# Patient Record
Sex: Male | Born: 1975 | Race: White | Hispanic: No | Marital: Married | Smoking: Never smoker
Health system: Southern US, Community
[De-identification: ages and names within clinical notes are randomized; demographics above are authoritative.]

## PROBLEM LIST (undated history)

## (undated) DIAGNOSIS — I10 Essential (primary) hypertension: Secondary | ICD-10-CM

## (undated) DIAGNOSIS — E079 Disorder of thyroid, unspecified: Secondary | ICD-10-CM

---

## 2017-09-04 ENCOUNTER — Encounter: Payer: Self-pay | Admitting: Emergency Medicine

## 2017-09-04 ENCOUNTER — Emergency Department (INDEPENDENT_AMBULATORY_CARE_PROVIDER_SITE_OTHER): Payer: BLUE CROSS/BLUE SHIELD

## 2017-09-04 ENCOUNTER — Other Ambulatory Visit: Payer: Self-pay

## 2017-09-04 ENCOUNTER — Emergency Department (INDEPENDENT_AMBULATORY_CARE_PROVIDER_SITE_OTHER)
Admission: EM | Admit: 2017-09-04 | Discharge: 2017-09-04 | Disposition: A | Discharge status: Home / Self Care | Payer: BLUE CROSS/BLUE SHIELD | Source: Home / Self Care | Attending: Family Medicine | Admitting: Family Medicine

## 2017-09-04 DIAGNOSIS — W268XXA Contact with other sharp object(s), not elsewhere classified, initial encounter: Secondary | ICD-10-CM | POA: Diagnosis not present

## 2017-09-04 DIAGNOSIS — M71122 Other infective bursitis, left elbow: Secondary | ICD-10-CM | POA: Diagnosis not present

## 2017-09-04 DIAGNOSIS — S51012A Laceration without foreign body of left elbow, initial encounter: Secondary | ICD-10-CM

## 2017-09-04 HISTORY — DX: Essential (primary) hypertension: I10

## 2017-09-04 HISTORY — DX: Disorder of thyroid, unspecified: E07.9

## 2017-09-04 MED ORDER — DOXYCYCLINE HYCLATE 100 MG PO CAPS: 100.0000 mg | ORAL_CAPSULE | Freq: Two times a day (BID) | ORAL | 0 refills | Status: AC

## 2017-09-04 MED ORDER — HYDROCODONE-ACETAMINOPHEN 5-325 MG PO TABS: 1.0000 | ORAL_TABLET | Freq: Four times a day (QID) | ORAL | 0 refills | Status: AC | PRN

## 2017-09-04 NOTE — ED Provider Notes (Signed)
[facility]    CSN: [id number] Arrival date & time: 09/04/17  1127     History   Chief Complaint Chief Complaint  Patient presents with  . Elbow Pain    injury    HPI Jordan Newman is a 42 y.o. male.   Patient reports that he lacerated his left elbow on a piece of metal at work about 8 days ago.  The laceration seemed to be healing well without drainage or pain until yesterday when he developed soreness, warmth, and swelling over the olecranon bursa.   He denies fevers, chills, and sweats, and feels well otherwise.  His last Tdap was about 4 to 5 years ago.  The history is provided by the patient.  Arm Injury  Location:  Elbow Elbow location:  L elbow Injury: yes   Time since incident:  8 days Mechanism of injury comment:  Laceration Pain details:    Quality:  Aching   Radiates to:  Does not radiate   Severity:  Mild   Onset quality:  Gradual   Duration:  2 days   Timing:  Constant   Progression:  Worsening Handedness:  Right-handed Foreign body present:  No foreign bodies Tetanus status:  Up to date Relieved by:  Nothing Exacerbated by: flexing ebow and touching area. Ineffective treatments:  None tried Associated symptoms: swelling   Associated symptoms: no decreased range of motion, no fatigue and no fever     Past Medical History:  Diagnosis Date  . Hypertension   . Thyroid disease     There are no active problems to display for this patient.   History reviewed. No pertinent surgical history.     Home Medications    Prior to Admission medications   Medication Sig Start Date End Date Taking? Authorizing Provider  levothyroxine (SYNTHROID, LEVOTHROID) 100 MCG tablet Take 100 mcg by mouth daily before breakfast.   Yes [provider]  lisinopril (PRINIVIL,ZESTRIL) 10 MG tablet Take 10 mg by mouth daily.   Yes [provider]  doxycycline (VIBRAMYCIN) 100 MG capsule Take 1 capsule (100 mg total) by mouth 2 (two) times  daily. Take with food. 09/04/17   Lattie HawBeese, Melquisedec Journey A, MD  HYDROcodone-acetaminophen (NORCO/VICODIN) 5-325 MG tablet Take 1 tablet by mouth every 6 (six) hours as needed for moderate pain. 09/04/17   Lattie HawBeese, Fayez Sturgell A, MD    Family History Family History  Problem Relation Age of Onset  . Diabetes Mother     Social History Social History   Tobacco Use  . Smoking status: Never Smoker  . Smokeless tobacco: Never Used  Substance Use Topics  . Alcohol use: Not Currently  . Drug use: Not on file     Allergies   Patient has no allergy information on record.   Review of Systems Review of Systems  Constitutional: Negative for chills, diaphoresis, fatigue and fever.  Skin: Positive for color change and wound. Negative for rash.  All other systems reviewed and are negative.    Physical Exam Triage Vital Signs ED Triage Vitals  Enc Vitals Group     BP 09/04/17 1154 102/66     Pulse Rate 09/04/17 1154 99     Resp 09/04/17 1154 16     Temp 09/04/17 1154 98.5 F (36.9 C)     Temp Source 09/04/17 1154 Oral     SpO2 09/04/17 1154 99 %     Weight 09/04/17 1157 203 lb (92.1 kg)     Height 09/04/17 1157  6' (1.829 m)     Head Circumference --      Peak Flow --      Pain Score 09/04/17 1156 7     Pain Loc --      Pain Edu? --      Excl. in GC? --    No data found.  Updated Vital Signs BP 102/66 (BP Location: Right Arm)   Pulse 99   Temp 98.5 F (36.9 C) (Oral)   Resp 16   Ht 6' (1.829 m)   Wt 203 lb (92.1 kg)   SpO2 99%   BMI 27.53 kg/m   Visual Acuity Right Eye Distance:   Left Eye Distance:   Bilateral Distance:    Right Eye Near:   Left Eye Near:    Bilateral Near:     Physical Exam  Constitutional: He appears well-developed and well-nourished. No distress.  HENT:  Head: Normocephalic.  Eyes: Pupils are equal, round, and reactive to light.  Cardiovascular: Normal rate.  Pulmonary/Chest: Effort normal.  Musculoskeletal:       Left elbow: He exhibits swelling.  He exhibits normal range of motion. Tenderness found. Olecranon process tenderness noted.       Arms: Left elbow has swelling, tenderness, warmth, and mild erythema over the olecranon bursa.  There is a healing superficial laceration about 8mm long over the bursa.  No drainage from the wound.   Neurological: He is alert.  Skin: Skin is warm and dry.  Nursing note and vitals reviewed.    UC Treatments / Results  Labs (all labs ordered are listed, but only abnormal results are displayed) Labs Reviewed  WOUND CULTURE  BODY FLUID CULTURE    EKG None  Radiology Dg Elbow Complete Left  Result Date: 09/04/2017 CLINICAL DATA:  Soft tissue swelling and redness posteriorly at site of recent laceration EXAM: LEFT ELBOW - COMPLETE 3+ VIEW COMPARISON:  None. FINDINGS: Frontal, lateral, and bilateral oblique views were obtained. No fracture or dislocation. No joint effusion evident. There is soft tissue swelling in the region of the olecranon bursa. There is no air or radiopaque foreign body in this area. There is evidence of soft tissue laceration in this area along the skin surface. IMPRESSION: Soft tissue swelling in the olecranon bursa region. Evidence of soft tissue laceration superficially, but no radiopaque foreign body or soft tissue air. No fracture or dislocation. No appreciable arthropathic change. No joint effusion. Electronically Signed   By: Bretta Bang III M.D.   On: 09/04/2017 12:15    Procedures Procedures  Left olecranon bursa aspiration: Risks and benefits of procedure explained to patient and verbal consent obtained.  Using sterile technique, cleansed affected area with Betadine and alcohol, followed by application of topical refrigerant spray.  With elbow flexed to 90 degrees, achieved local anesthesia with 1% lidocaine.  Using a 10cc syringe with 18 gage needle, inserted the needle distally at the base of the bursa, parallel to the ulna.  Aspirated small amount of viscous  hemorrhagic fluid.  Applied sterile bandage, followed by light compression with Ace wrap. Medications Ordered in UC Medications - No data to display  Initial Impression / Assessment and Plan / UC Course  I have reviewed the triage vital signs and the nursing notes.  Pertinent labs & imaging results that were available during my care of the patient were reviewed by me and considered in my medical decision making (see chart for details).    X-ray reveals no evidence of foreign body.  Light compression applied with Ace wrap. Begin doxycycline for staph coverage.  Bursa fluid culture pending. Rx for Lortab. Controlled Substance Prescriptions I have consulted the [place] Controlled Substances Registry for this patient, and feel the risk/benefit ratio today is favorable for proceeding with this prescription for a controlled substance.   Followup with Dr. Rodney Langton or Dr. Clementeen [facility] ([facility]) in 48 hours   Final Clinical Impressions(s) / UC Diagnoses   Final diagnoses:  Septic olecranon bursitis of left elbow     Discharge Instructions     May take Ibuprofen 200mg , 4 tabs every 8 hours with food.  Elevate arm. If symptoms become significantly worse during the night or over the weekend, proceed to the local emergency room.    ED Prescriptions    Medication Sig Dispense Auth. Provider   doxycycline (VIBRAMYCIN) 100 MG capsule Take 1 capsule (100 mg total) by mouth 2 (two) times daily. Take with food. 20 capsule Lattie Haw, MD   HYDROcodone-acetaminophen (NORCO/VICODIN) 5-325 MG tablet Take 1 tablet by mouth every 6 (six) hours as needed for moderate pain. 15 tablet Lattie Haw, MD        Lattie Haw, MD 09/05/17 [phone]

## 2017-09-04 NOTE — Discharge Instructions (Addendum)
May take Ibuprofen 200mg , 4 tabs every 8 hours with food.  Elevate arm. If symptoms become significantly worse during the night or over the weekend, proceed to the local emergency room.

## 2017-09-04 NOTE — ED Triage Notes (Signed)
Patient reports cutting left elbow on piece of metal at work about 8 days ago; now inflamed, edematous and sore. No OTCs today.

## 2017-09-08 ENCOUNTER — Telehealth: Payer: Self-pay | Admitting: Emergency Medicine

## 2017-09-08 NOTE — Telephone Encounter (Signed)
Message left on voice mail inquiring about patient's status and encouraging patient to call with questions/concerns. Informed him that wound culture was negative.

## 2017-09-09 LAB — WOUND CULTURE
MICRO NUMBER:: 90803680
RESULT:: NO GROWTH
SPECIMEN QUALITY:: ADEQUATE

## 2019-04-13 IMAGING — DX DG ELBOW COMPLETE 3+V*L*
4 series · 4 of 4 positions shown · non-contrast
Comparison: None.

CLINICAL DATA: Soft tissue swelling and redness posteriorly at site
of recent laceration

EXAM:
LEFT ELBOW - COMPLETE 3+ VIEW

[elbow ap]
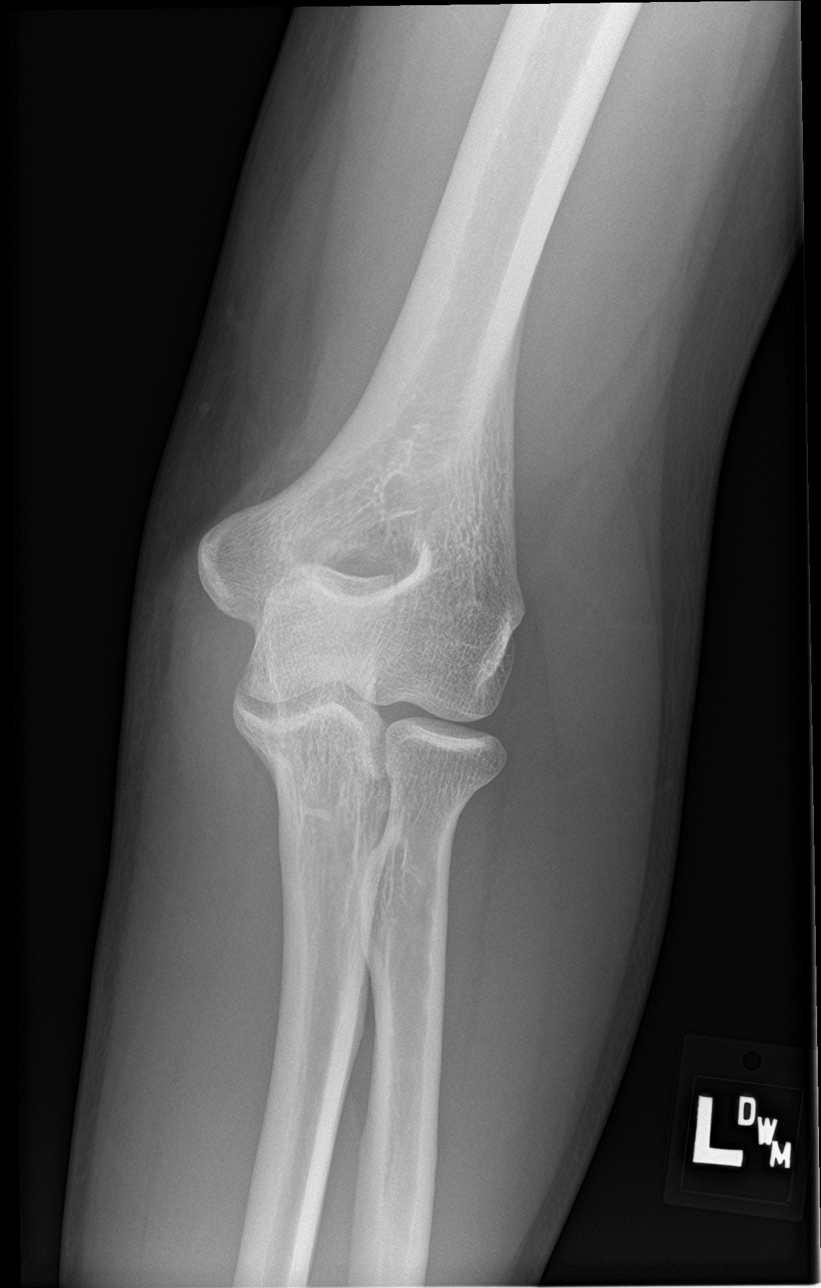

[elbow obl (1 of 2)]
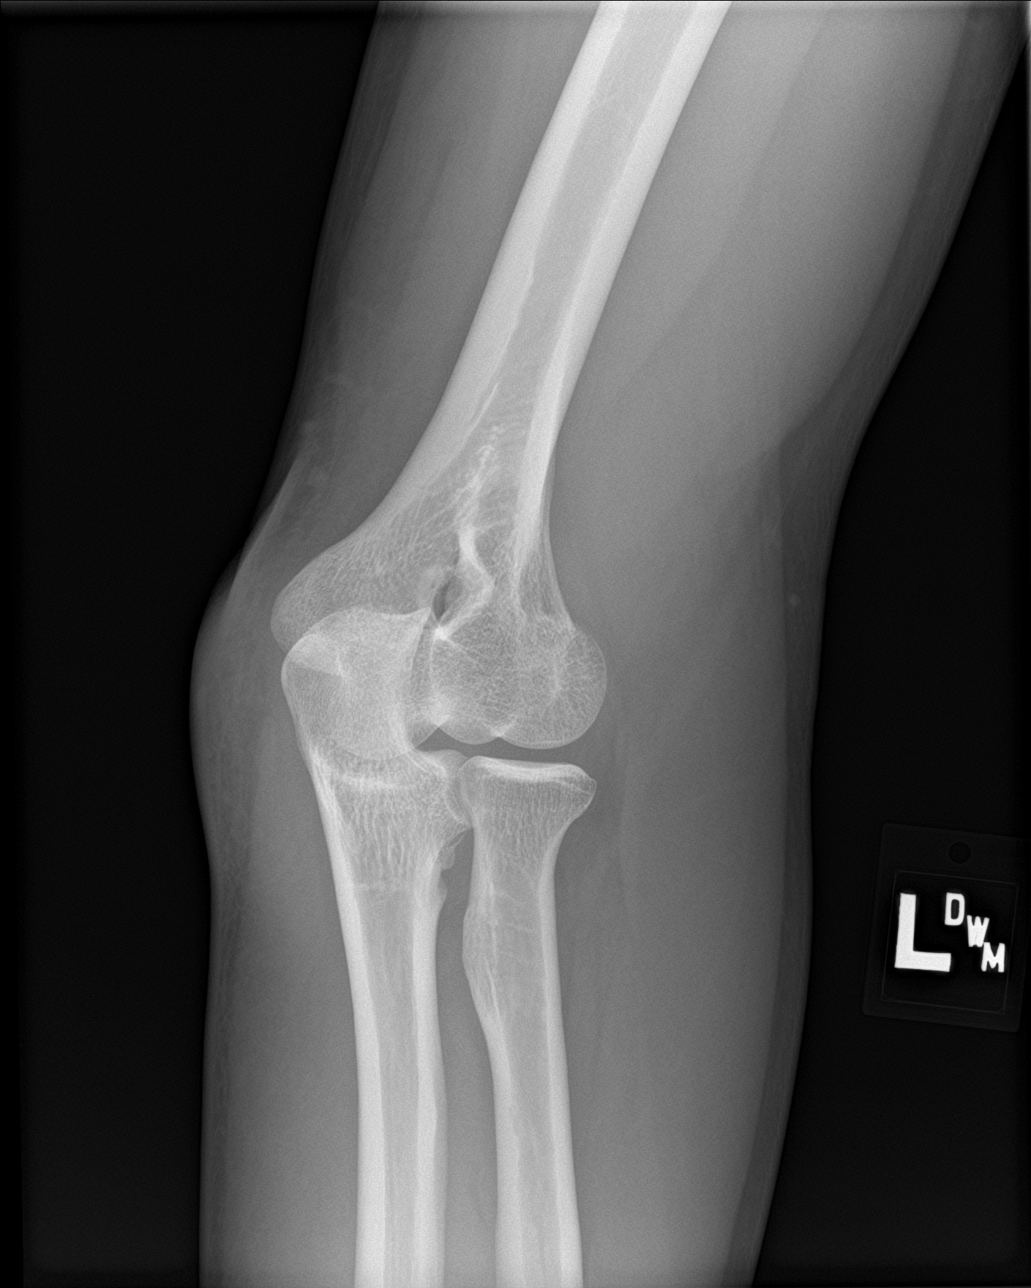

[elbow obl (2 of 2)]
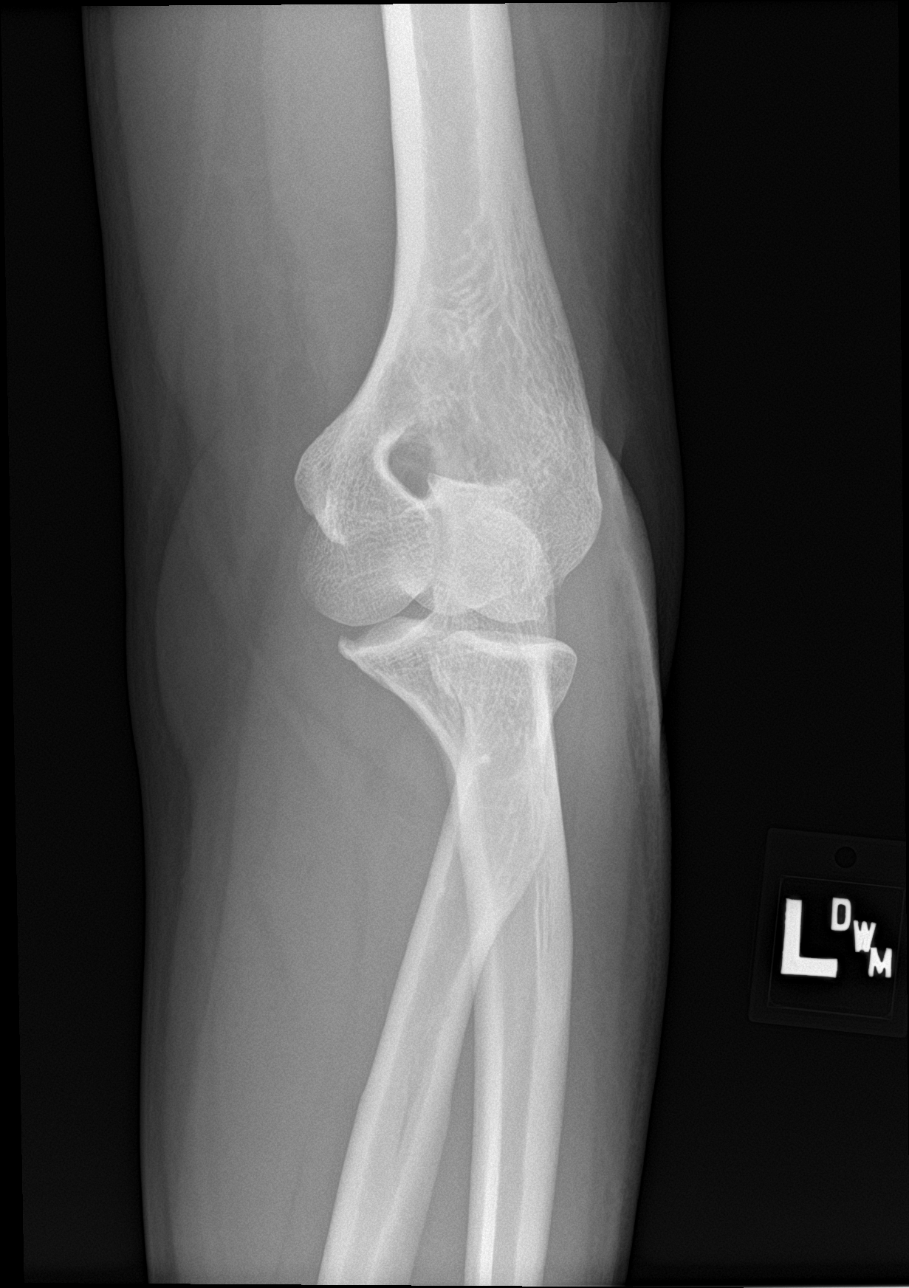

[elbow lat]
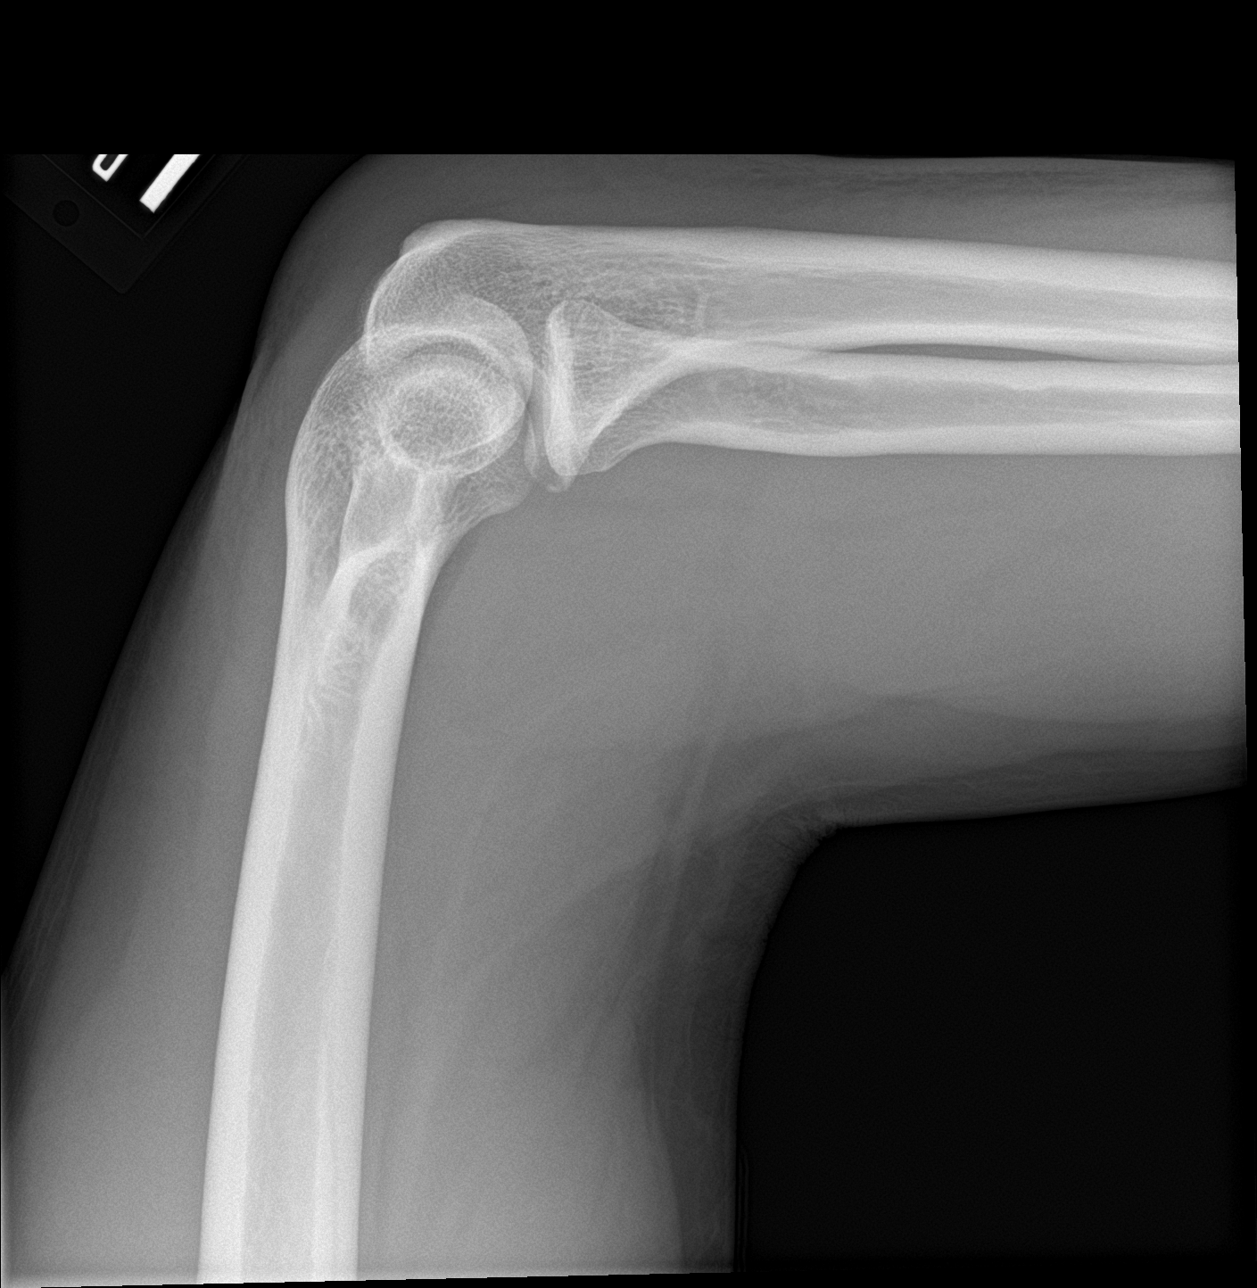

[4 of 4 positions shown; findings below may reference images not displayed]

FINDINGS: Frontal, lateral, and bilateral oblique views were obtained. No
fracture or dislocation. No joint effusion evident.

There is soft tissue swelling in the region of the olecranon bursa.
There is no air or radiopaque foreign body in this area. There is
evidence of soft tissue laceration in this area along the skin
surface.
IMPRESSION: Soft tissue swelling in the olecranon bursa region. Evidence of soft
tissue laceration superficially, but no radiopaque foreign body or
soft tissue air.

No fracture or dislocation. No appreciable arthropathic change. No
joint effusion.
